# Patient Record
Sex: Male | Born: 1995 | Hispanic: Yes | Marital: Single
Health system: Southern US, Community
[De-identification: ages and names within clinical notes are randomized; demographics above are authoritative.]

---
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
## 2020-07-19 ENCOUNTER — Emergency Department (HOSPITAL_COMMUNITY)
Admission: EM | Admit: 2020-07-19 | Discharge: 2020-07-20 | Payer: BC Managed Care – PPO | Attending: Emergency Medicine | Admitting: Emergency Medicine

## 2020-07-19 ENCOUNTER — Emergency Department (HOSPITAL_COMMUNITY): Payer: BC Managed Care – PPO

## 2020-07-19 ENCOUNTER — Encounter (HOSPITAL_COMMUNITY): Payer: Self-pay | Admitting: Emergency Medicine

## 2020-07-19 DIAGNOSIS — F29 Unspecified psychosis not due to a substance or known physiological condition: Secondary | ICD-10-CM

## 2020-07-19 DIAGNOSIS — S60812A Abrasion of left wrist, initial encounter: Secondary | ICD-10-CM | POA: Diagnosis not present

## 2020-07-19 DIAGNOSIS — Y92009 Unspecified place in unspecified non-institutional (private) residence as the place of occurrence of the external cause: Secondary | ICD-10-CM | POA: Insufficient documentation

## 2020-07-19 DIAGNOSIS — Z20822 Contact with and (suspected) exposure to covid-19: Secondary | ICD-10-CM | POA: Diagnosis not present

## 2020-07-19 DIAGNOSIS — W098XXA Fall on or from other playground equipment, initial encounter: Secondary | ICD-10-CM | POA: Diagnosis not present

## 2020-07-19 DIAGNOSIS — S00531A Contusion of lip, initial encounter: Secondary | ICD-10-CM | POA: Insufficient documentation

## 2020-07-19 DIAGNOSIS — F1590 Other stimulant use, unspecified, uncomplicated: Secondary | ICD-10-CM | POA: Diagnosis not present

## 2020-07-19 DIAGNOSIS — F989 Unspecified behavioral and emotional disorders with onset usually occurring in childhood and adolescence: Secondary | ICD-10-CM | POA: Diagnosis not present

## 2020-07-19 DIAGNOSIS — S0081XA Abrasion of other part of head, initial encounter: Secondary | ICD-10-CM | POA: Diagnosis not present

## 2020-07-19 DIAGNOSIS — S60811A Abrasion of right wrist, initial encounter: Secondary | ICD-10-CM | POA: Insufficient documentation

## 2020-07-19 DIAGNOSIS — F1410 Cocaine abuse, uncomplicated: Secondary | ICD-10-CM

## 2020-07-19 DIAGNOSIS — S00501A Unspecified superficial injury of lip, initial encounter: Secondary | ICD-10-CM | POA: Diagnosis present

## 2020-07-19 LAB — COMPREHENSIVE METABOLIC PANEL
ALT: 40 U/L (ref 0–44)
AST: 55 U/L — ABNORMAL HIGH (ref 15–41)
Albumin: 4.3 g/dL (ref 3.5–5.0)
Alkaline Phosphatase: 52 U/L (ref 38–126)
Anion gap: 11 (ref 5–15)
BUN: 15 mg/dL (ref 6–20)
CO2: 23 mmol/L (ref 22–32)
Calcium: 9.2 mg/dL (ref 8.9–10.3)
Chloride: 104 mmol/L (ref 98–111)
Creatinine, Ser: 1.28 mg/dL — ABNORMAL HIGH (ref 0.61–1.24)
GFR, Estimated: >60 mL/min (ref >60)
Glucose, Bld: 125 mg/dL — ABNORMAL HIGH (ref 70–99)
Potassium: 4.1 mmol/L (ref 3.5–5.1)
Sodium: 138 mmol/L (ref 135–145)
Total Bilirubin: 1.2 mg/dL (ref 0.3–1.2)
Total Protein: 7.2 g/dL (ref 6.5–8.1)

## 2020-07-19 LAB — CBC WITH DIFFERENTIAL/PLATELET
Abs Immature Granulocytes: 0.11 10*3/uL — ABNORMAL HIGH (ref 0.00–0.07)
Basophils Absolute: 0 10*3/uL (ref 0.0–0.1)
Basophils Relative: 0 %
Eosinophils Absolute: 0 10*3/uL (ref 0.0–0.5)
Eosinophils Relative: 0 %
HCT: 43.1 % (ref 39.0–52.0)
Hemoglobin: 14.8 g/dL (ref 13.0–17.0)
Immature Granulocytes: 1 %
Lymphocytes Relative: 6 %
Lymphs Abs: 1.2 10*3/uL (ref 0.7–4.0)
MCH: 29.1 pg (ref 26.0–34.0)
MCHC: 34.3 g/dL (ref 30.0–36.0)
MCV: 84.7 fL (ref 80.0–100.0)
Monocytes Absolute: 1.7 10*3/uL — ABNORMAL HIGH (ref 0.1–1.0)
Monocytes Relative: 9 %
Neutro Abs: 16.3 10*3/uL — ABNORMAL HIGH (ref 1.7–7.7)
Neutrophils Relative %: 84 %
Platelets: 369 10*3/uL (ref 150–400)
RBC: 5.09 MIL/uL (ref 4.22–5.81)
RDW: 12.5 % (ref 11.5–15.5)
WBC: 19.4 10*3/uL — ABNORMAL HIGH (ref 4.0–10.5)
nRBC: 0 % (ref 0.0–0.2)

## 2020-07-19 LAB — RAPID URINE DRUG SCREEN, HOSP PERFORMED
Amphetamines: NOT DETECTED
Barbiturates: NOT DETECTED
Benzodiazepines: NOT DETECTED
Cocaine: POSITIVE — AB
Opiates: NOT DETECTED
Tetrahydrocannabinol: POSITIVE — AB

## 2020-07-19 LAB — SALICYLATE LEVEL: Salicylate Lvl: <7 mg/dL — ABNORMAL LOW (ref 7.0–30.0)

## 2020-07-19 LAB — RESPIRATORY PANEL BY RT PCR (FLU A&B, COVID)
Influenza A by PCR: NEGATIVE
Influenza B by PCR: NEGATIVE
SARS Coronavirus 2 by RT PCR: NEGATIVE

## 2020-07-19 LAB — ETHANOL: Alcohol, Ethyl (B): <10 mg/dL (ref <10)

## 2020-07-19 LAB — ACETAMINOPHEN LEVEL: Acetaminophen (Tylenol), Serum: <10 ug/mL — ABNORMAL LOW (ref 10–30)

## 2020-07-19 MED ORDER — ACETAMINOPHEN 325 MG PO TABS
650.0000 mg | ORAL_TABLET | Freq: Once | ORAL | Status: DC
  Filled 2020-07-19: qty 2

## 2020-07-19 MED ORDER — BACITRACIN ZINC 500 UNIT/GM EX OINT: TOPICAL_OINTMENT | Freq: Two times a day (BID) | CUTANEOUS | Status: DC
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
## 2020-07-19 NOTE — ED Provider Notes (Deleted)
Medical screening examination/treatment/procedure(s) were conducted as a shared visit with non-physician practitioner(s) and myself.  I personally evaluated the patient during the encounter.       Arby Barrette, MD 07/20/20 1446

## 2020-07-19 NOTE — Progress Notes (Signed)
Per Elta Guadeloupe, NP, pt meets inpatient criteria. There are no available beds at [facility] currently. Per Gray Bernhardt, Interim-President at [facility], Disposition will no longer fax patients out who are in [facility] EDs, unless the patient specifically asks to be placed somewhere other than [facility].   TTS continuing to assess.  Aldonia Keeven S. Alan Ripper, MSW, LCSW Clinical Social Worker 07/19/2020 4:05 PM

## 2020-07-19 NOTE — ED Notes (Signed)
[employer] at bedside 

## 2020-07-19 NOTE — BH Assessment (Signed)
Tele Assessment Note   Patient Name: Matthew Harrison MRN: [id number] Referring Physician: EDP Location of Patient: [facility] Location of Provider: Behavioral Health TTS Department  Matthew Harrison is a 24 y.o. male who presented to [facility] in police custody.  Pt lives alone in [place], and he is unemployed.  Pt stated that he has not been assessed by TTS before.  Per hospital report, [employer] went to Pt's home after neighbors reported suspicious noises and observable behavior.  Pt was observed standing naked on his roof and then jumping 8 feet to the gravel driveway below.  Per report, Pt told hospital staff, '''Everybody is acting strange to me... I jump off the roof all the time.''  Pt is now in police custody.  Pt has not been assessed by TTS before.  Pt was assessed.  He had to be asked to remove the sheet off his head.  He reported that he did not know why he was at the hospital.  ''The police just stormed me.''  Pt admitted that he climbed onto his roof and jumped off while naked, stating that he has done it before.  ''I was just minding my own business.''  Pt stated that he has jumped off his roof before.  ''I jump on and off a lot of things.''  Pt stated that he is trying to lose weight and he exercises.  Pt denied that his jump was a suicide attempt, and he denied past suicidal ideation or attempt, homicidal ideation, hallucination, and self-injurious behavior.  Pt endorsed use of cocaine, Delta 8, and marijuana.  Pt was vague about how often and frequency -- he stated that he ''may'' have used cocaine and Delta 8 recently.  He reported that he uses Delta 8 to help with sleep.  Pt denied any past or current psychiatric care.  UDS was not available at time of assessment.  Pt stated that he wanted to leave the hospital.  He reported that his home does not have power --''I can't see s** at night.''  During assessment, Pt presented as alert and oriented.  He had fair eye contact.  Demeanor was guarded.  Pt was  shirtless.  Pt's mood was ambiguous, and affect was ambivalent.  Speech was normal in rate, rhythm, and volume.  Thought processes were within normal range, and thought content was circumstantial.  Memory and concentration were intact.  Insight, judgment, and impulse control were poor.  Consulted with Rhona Raider, NP, who determined that Pt should remain at ED overnight, sober and stabilize, and evaluate again for suicidality. Diagnosis: Cocaine Use Disorder; bizarre behavior.   Past Medical History: History reviewed. No pertinent past medical history.  History reviewed. No pertinent surgical history.  Family History: No family history on file.  Social History:  reports current alcohol use. He reports current drug use. Drugs: Marijuana, Cocaine, and Other-see comments. No history on file for tobacco use.  Additional Social History:  Alcohol / Drug Use Pain Medications: See MAR Prescriptions: See MAR Over the Counter: See MAR History of alcohol / drug use?: Yes Substance #1 Name of Substance 1: Cocaine 1 - Amount (size/oz): Varied 1 - Frequency: Episodic 1 - Duration: Ongoing 1 - Last Use / Amount: Approx 07/17/2020 Substance #2 Name of Substance 2: Delta 8 2 - Last Use / Amount: Not sure Substance #3 Name of Substance 3: Marijuana  CIWA: CIWA-Ar BP: (!) 159/101 Pulse Rate: (!) 125 COWS:    Allergies: No Known Allergies  Home Medications: (Not in a  hospital admission)   OB/GYN Status:  No LMP for male patient.  General Assessment Data Location of Assessment: [facility] TTS Assessment: In system Is this a Tele or Face-to-Face Assessment?: Tele Assessment Is this an Initial Assessment or a Re-assessment for this encounter?: Initial Assessment Language Other than English: No Living Arrangements: Other (Comment) What gender do you identify as?: Male Date Telepsych consult ordered in [facility]: 07/19/20 Marital status: Single Pregnancy Status: No Living Arrangements: Alone Can pt  return to current living arrangement?: Yes Admission Status: Other (Comment) (in police custody) Is patient capable of signing voluntary admission?: Yes Referral Source: Other ([employer]) Insurance type:  Government social research officer)     Crisis Care Plan Living Arrangements: Alone Name of Psychiatrist:  (Pt denied) Name of Therapist:  (Pt denied)  Education Status Is patient currently in school?: No Is the patient employed, unemployed or receiving disability?: Unemployed  Risk to self with the past 6 months Suicidal Ideation: No Has patient been a risk to self within the past 6 months prior to admission? : No Suicidal Intent: No Has patient had any suicidal intent within the past 6 months prior to admission? : No Is patient at risk for suicide?: No Suicidal Plan?: No Has patient had any suicidal plan within the past 6 months prior to admission? : No Access to Means: No What has been your use of drugs/alcohol within the last 12 months?:  (Cocaine, Delta 8, Marijuana, Alcohol) Previous Attempts/Gestures: No Other Self Harm Risks:  (Substanced use) Intentional Self Injurious Behavior: Damaging Comment - Self Injurious Behavior:  (Pt has jumped off his roof several times) Family Suicide History: No Recent stressful life event(s): Other (Comment) (Substance use) Persecutory voices/beliefs?: No Depression: No Depression Symptoms: Insomnia Substance abuse history and/or treatment for substance abuse?: Yes Suicide prevention information given to non-admitted patients: Not applicable  Risk to Others within the past 6 months Homicidal Ideation: No Does patient have any lifetime risk of violence toward others beyond the six months prior to admission? : No Thoughts of Harm to Others: No Current Homicidal Intent: No Current Homicidal Plan: No Access to Homicidal Means: No History of harm to others?: No Does patient have access to weapons?: No Criminal Charges Pending?: No Does patient have a court date:  No Is patient on probation?: Unknown  Psychosis Hallucinations: None noted Delusions: None noted  Mental Status Report Appearance/Hygiene: Other (Comment) (shritless) Eye Contact: Fair Motor Activity: Freedom of movement, Unremarkable Speech: Other (Comment) (guarded) Level of Consciousness: Alert Mood: Ambivalent Affect: Other (Comment) (Guarded) Anxiety Level: None Thought Processes: Circumstantial Judgement: Impaired Orientation: Person, Place, Time, Situation Obsessive Compulsive Thoughts/Behaviors: None  Cognitive Functioning Concentration: Fair Memory: Recent Intact, Remote Intact Is patient IDD: No Insight: Poor Impulse Control: Poor Appetite: Good Have you had any weight changes? : No Change Sleep: Decreased Total Hours of Sleep:  (Mixed without use of Delta 8 to aid sleep) Vegetative Symptoms: None  ADLScreening [facility] Assessment Services) Patient's cognitive ability adequate to safely complete daily activities?: Yes Patient able to express need for assistance with ADLs?: Yes Independently performs ADLs?: Yes (appropriate for developmental age)  Prior Inpatient Therapy Prior Inpatient Therapy: No  Prior Outpatient Therapy Prior Outpatient Therapy: No Does patient have an ACCT team?: No Does patient have Intensive In-House Services?  : No Does patient have [facility] services? : No Does patient have P4CC services?: No  ADL Screening (condition at time of admission) Patient's cognitive ability adequate to safely complete daily activities?: Yes Is the patient deaf or  have difficulty hearing?: No Does the patient have difficulty seeing, even when wearing glasses/contacts?: No Patient able to express need for assistance with ADLs?: Yes Does the patient have difficulty dressing or bathing?: No Independently performs ADLs?: Yes (appropriate for developmental age) Does the patient have difficulty walking or climbing stairs?: No Weakness of Legs: None Weakness of  Arms/Hands: None  Home Assistive Devices/Equipment Home Assistive Devices/Equipment: None  Therapy Consults (therapy consults require a physician order) PT Evaluation Needed: No OT Evalulation Needed: No SLP Evaluation Needed: No Abuse/Neglect Assessment (Assessment to be complete while patient is alone) Abuse/Neglect Assessment Can Be Completed: Yes Physical Abuse: Denies Verbal Abuse: Denies Sexual Abuse: Denies Exploitation of patient/patient's resources: Denies Self-Neglect: Denies Values / Beliefs Cultural Requests During Hospitalization: None Spiritual Requests During Hospitalization: None Consults Spiritual Care Consult Needed: No Transition of Care Team Consult Needed: No Advance Directives (For Healthcare) Does Patient Have a Medical Advance Directive?: No Would patient like information on creating a medical advance directive?: No - Patient declined          Disposition:  Disposition Initial Assessment Completed for this Encounter: Yes  This service was provided via telemedicine using a 2-way, interactive audio and video technology.  Names of all persons participating in this telemedicine service and their role in this encounter. Name: Cletis Athens Role: Patient             Earline Mayotte 07/19/2020 2:28 PM

## 2020-07-19 NOTE — ED Notes (Signed)
IVC papers served.  Original Findings & Custody, Affidavit, and Exam and Recommendation documents placed in magistrate folder.  A copy sent to medical records, copy faxed to [facility] [phone],  and three sets on patient clipboard as per done by Wynelle Bourgeois, Diplomatic Services operational officer.

## 2020-07-19 NOTE — ED Notes (Signed)
Officer removed cuffs from wrists and pt has several lacerations to bilateral wrists, bleeding controlled at this time.

## 2020-07-20 NOTE — ED Notes (Signed)
Lunch Tray Ordered @ 1050. 

## 2020-07-20 NOTE — ED Provider Notes (Signed)
Emergency Medicine Observation Re-evaluation Note  Matthew Harrison is a 24 y.o. male, seen on rounds today.  Pt initially presented to the ED for complaints of Behavior Problem Currently, the patient is resting comfortably in no distress, denies SI.HI  Physical Exam  BP (!) 161/91   Pulse 99   Temp 98.8 F (37.1 C) (Oral)   Resp 18   SpO2 99%  Physical Exam General: no distress   Psych: calm and cooperative  ED Course / MDM  EKG:  Clinical Course as of Jul 20 1150  Sun Jul 19, 2020  1401 Leukocytosis present likely demarginalization secondary to being clubbed  CBC with Diff(!) [WF]  1401 He has no infectious symptoms.   [WF]  1401 CMP without any significant abnormality.  Mild AST elevation likely secondary to some alcohol use which he endorses.   [WF]  1401 Ethanol salicylate and Tylenol all undetectable.   [WF]  1401 Rapid urine drug screen and Covid PCR pending.   [WF]  1401 This has   [WF]  1402 TTS has called to evaluate patient.   [WF]  1445 Left ankle and tib-fib plain film without any fractures.  No dislocations.  Agree of radiology read   [WF]  1445 Covid test is negative.  Urine drug screen pending.   [WF]    Clinical Course User Index [WF] [facility]ail[facility], [place]   I have reviewed the labs performed to date as well as medications administered while in observation.  Recent changes in the last 24 hours include re-evaluated by Psych and cleared for discharge.  Plan  Current plan is for discharge into police custody.  Patient is under full IVC at this time which will be reversed.    Pollyann Savoy, MD 07/20/20 1153

## 2020-07-20 NOTE — ED Notes (Signed)
Ordered breakfast 

## 2020-07-20 NOTE — ED Notes (Signed)
Pt is awake,alert,calm,cooperative speaking with tts. GP remains with patient.

## 2020-07-20 NOTE — Progress Notes (Signed)
Patient ID: Matthew Harrison, male   DOB: 19-Nov-1995, 24 y.o.   MRN: [id number]   Psychiatric Reassessment   [name] Matthew Harrison is a 24 y.o. male who presented to [facility] in police custody.  Pt lives alone in [place], and he is unemployed.  Pt stated that he has not been assessed by TTS before.  Per hospital report, [employer] went to Pt's home after neighbors reported suspicious noises and observable behavior.  Pt was observed standing naked on his roof and then jumping 8 feet to the gravel driveway below.  Per report, Pt told hospital staff, '''Everybody is acting strange to me... I jump off the roof all the time.''  Pt is now in police custody.  Pt has not been assessed by TTS before.  Pt was assessed.  He had to be asked to remove the sheet off his head.  He reported that he did not know why he was at the hospital.  ''The police just stormed me.''  Pt admitted that he climbed onto his roof and jumped off while naked, stating that he has done it before.  ''I was just minding my own business.''  Pt stated that he has jumped off his roof before.  ''I jump on and off a lot of things.''  Pt stated that he is trying to lose weight and he exercises.  Pt denied that his jump was a suicide attempt, and he denied past suicidal ideation or attempt, homicidal ideation, hallucination, and self-injurious behavior.  Pt endorsed use of cocaine, Delta 8, and marijuana.  Pt was vague about how often and frequency -- he stated that he ''may'' have used cocaine and Delta 8 recently.  He reported that he uses Delta 8 to help with sleep.  Pt denied any past or current psychiatric care.  UDS was not available at time of assessment.  Pt stated that he wanted to leave the hospital.  He reported that his home does not have power --''I can't see s** at night.''  Psychiatric Evaluation:Matthew Harrison is a 24 y.o. male who presented to [facility] in police custody.for concerns as noted above. During this evaluation, he was alert and oriented x4,  guarded but cooperative. He has no prior psychiatric history other than substance abuse per self-report. When asked about his reason for being taken to the ED by police he replied," I don't know. All I know is that the police tased me.and brought me here."  He admitted to being found standing on his roof and then jumping 8 feet to the gravel driveway below. He denied that he was naked. Stated he was on the roof because," I saw an airplane and I wanted to see it closer." He admitted to using both cocaine, Delta 8, and marijuana but declined to disclose how frequent the use. His UDS was positive for THC and cocaine. He denied past suicidal ideation or attempt, homicidal ideation, hallucination, and self-injurious behavior.Denied access to firearm. Gave verbal consent to speak to his significant other, Abran Duke and roommate Alferd Apa  for collateral information. Stated he did not know room mates number and it was in his phone which he did not have.   I spoke to [employer], [phone] for collateral support. She stated that currently, she is not in town however, she did receive information that patient was  the hospital. She stated," I just heard that he had been having some crazy behaviors, but he has never done anything like that in the past. I am not sure if  he is using drugs but I saw were he sent a guy a message and some money who is known to use drugs so maybe he had something." She added that patient has never tried to harm himself in the past and that she did not think he would. Stated that she had spoke to patients room mate about the situation and provided patients room mate number. She denied that there were firearms int he home.   I spoke to patients roommate who stated," I have been knowing him for 7 years and we had been room mates for the last three. He is not a bad person but my neighbors told me about hs behaviors which was a surprise to me." He stated that he was unaware of any drug use but  did know that patient drinks alcohol often.  Reported that 4-5 years ago, patient had a similar incident like this where his behaviors were bizarre. Stated the neighbors said that they were concerned because he was on top of a roof. Stated," I feel at times he might have been depressed but he has never tried to harm himself or anyone." He denied that there were firearms in the home.   Disposition: Patient denies current IS, HI and psychosis. During this evaluation, there were no signs that he was responding to internal or external stimuli. I spoke to patients nurse who also stated that she has not observed signs of psychosis. She added that [employer] has remained at patients bedside because patient has current warrant for assault on an officer.   There is no evidence of imminent risk to self or others at present. There are no  current signs of psychosis. 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 ED nurse updated on disposition.

## 2020-07-22 ENCOUNTER — Encounter: Payer: Self-pay | Admitting: Emergency Medicine

## 2020-08-09 NOTE — Progress Notes (Deleted)
   Subjective:    Patient ID: Matthew Harrison, male    DOB: 07/18/1996, 24 y.o.   MRN: [id number]  24 y.o.M f/u hosp visit      Review of Systems     Objective:   Physical Exam        Assessment & Plan:

## 2020-08-10 ENCOUNTER — Inpatient Hospital Stay: Payer: Self-pay | Admitting: Critical Care Medicine

## 2022-01-17 IMAGING — DX DG ANKLE COMPLETE 3+V*L*
3 series · 3 of 3 positions shown · non-contrast
Comparison: None.

CLINICAL DATA: Lower leg injury

EXAM:
LEFT ANKLE COMPLETE - 3+ VIEW; LEFT TIBIA AND FIBULA - 2 VIEW

[ankle ap]
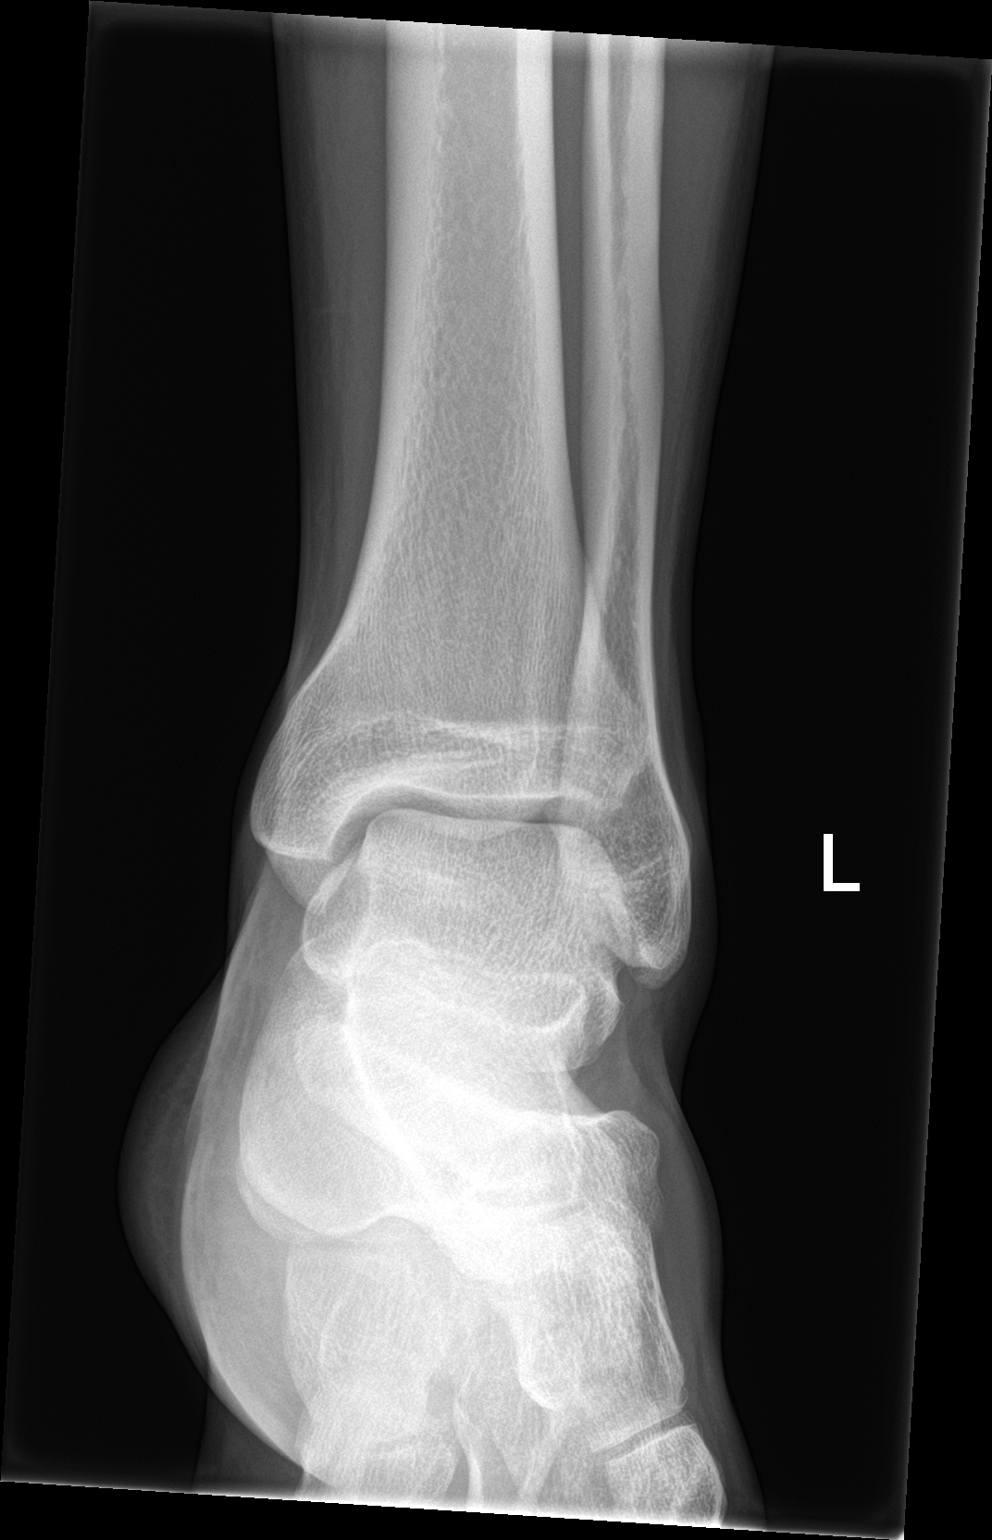

[ankle obl]
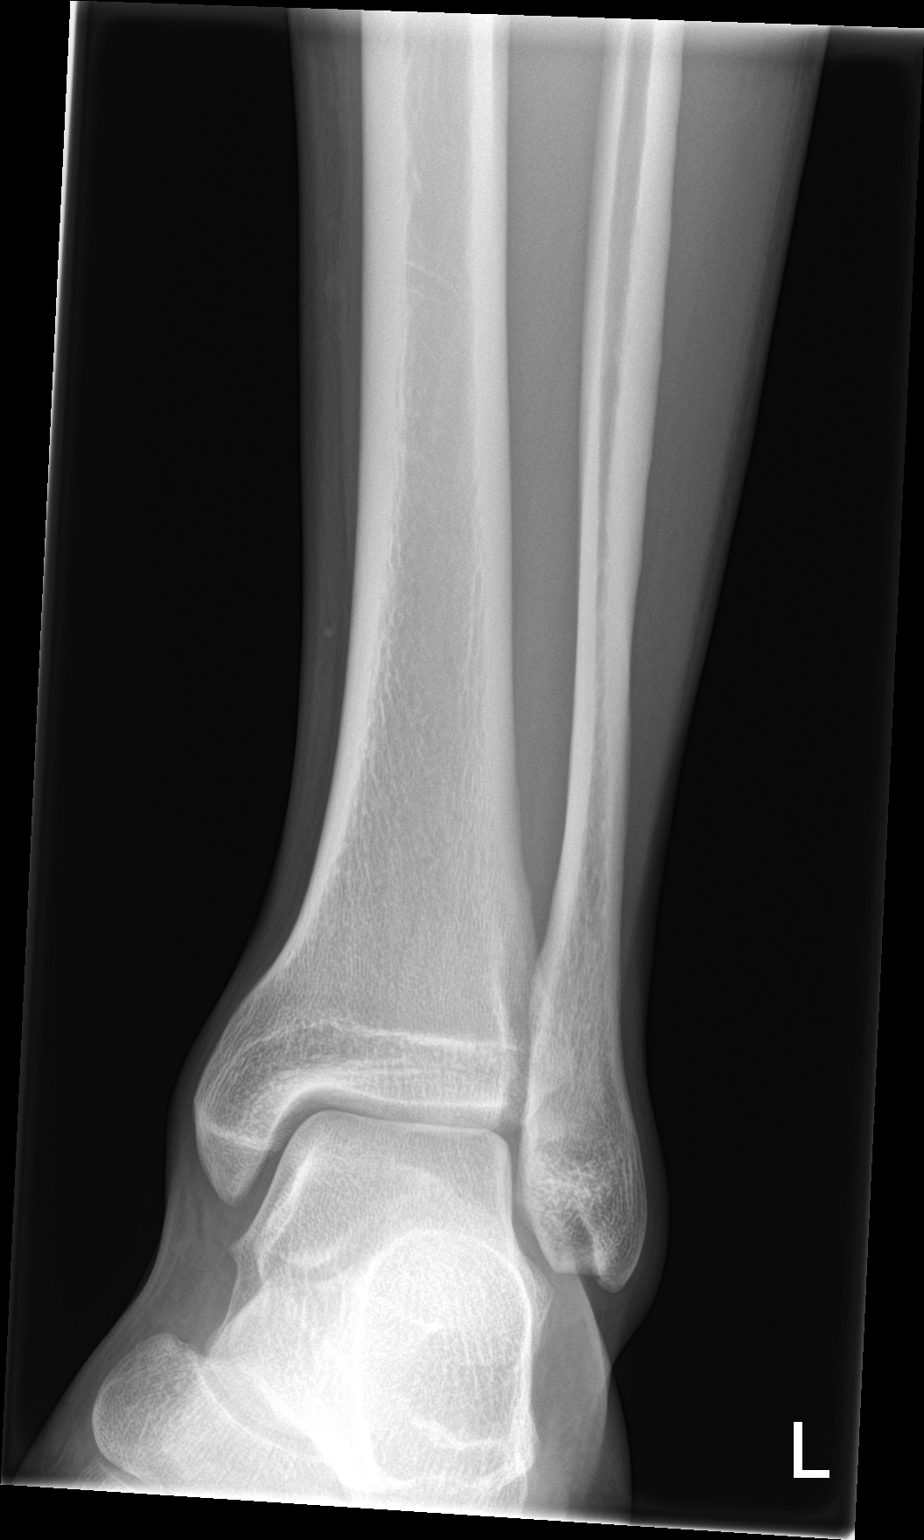

[ankle lat]
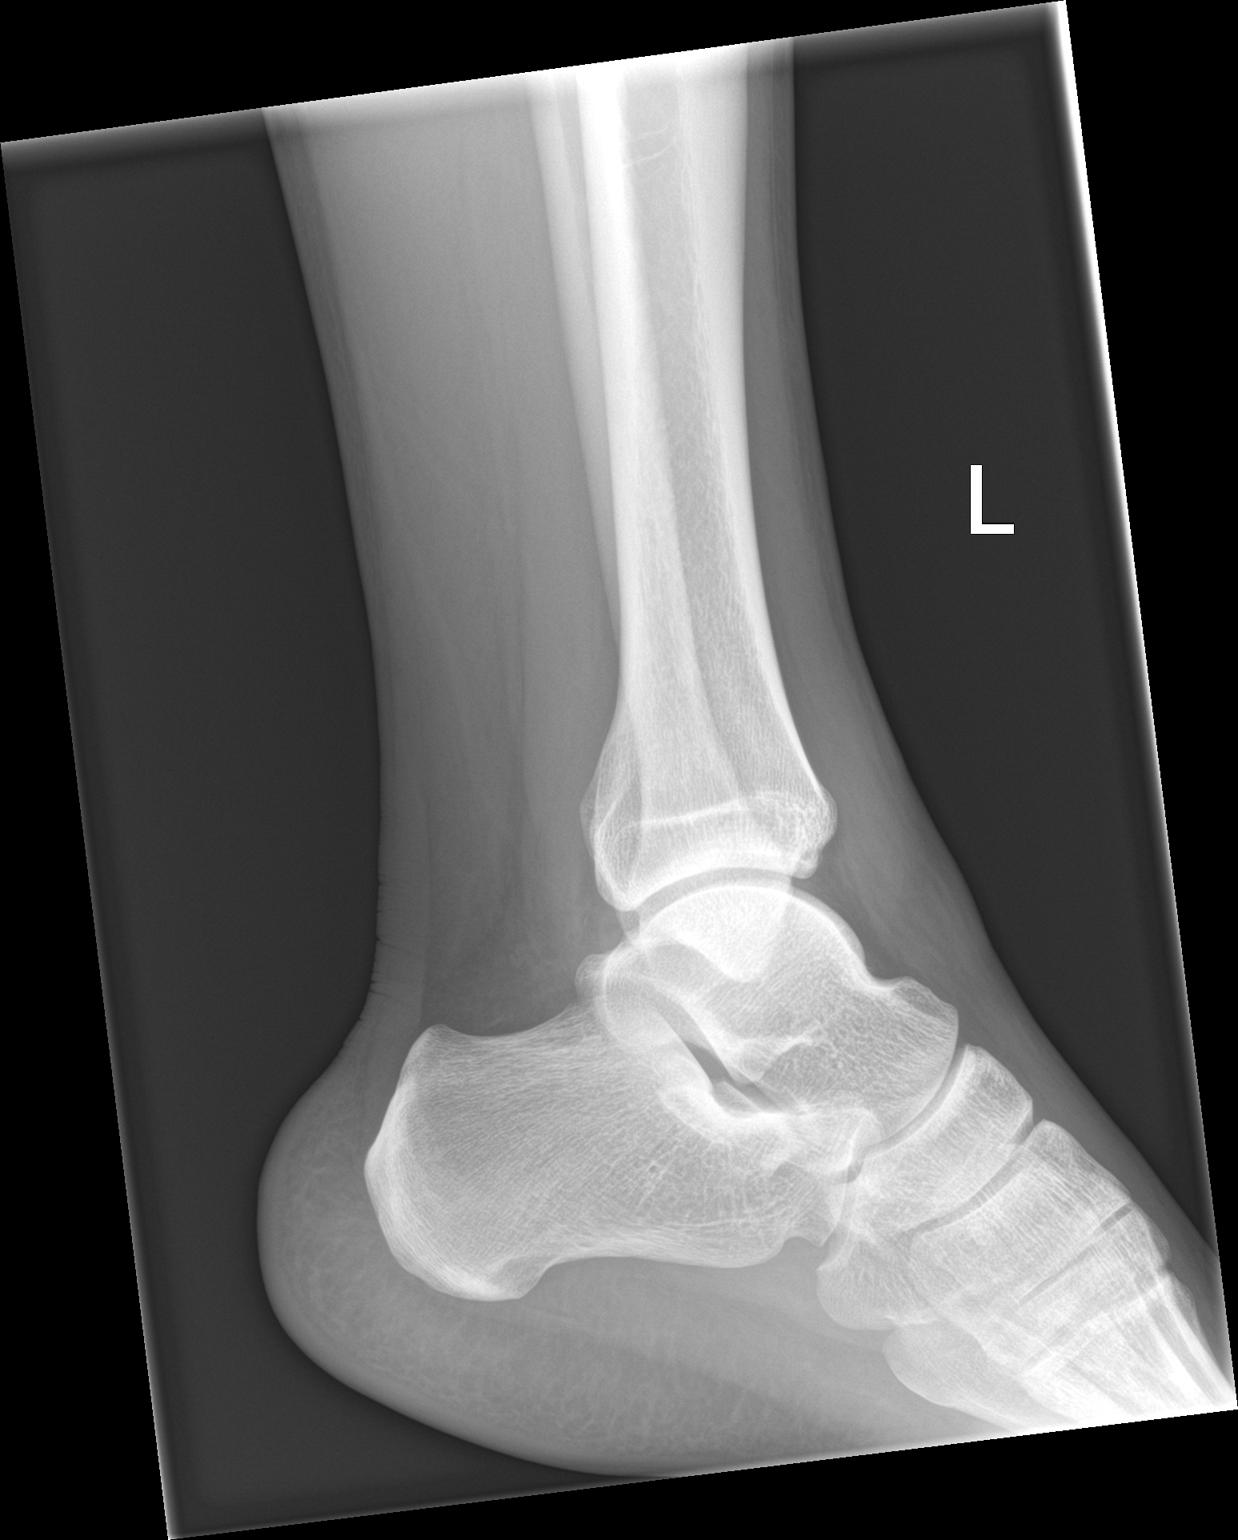

[3 of 3 positions shown; findings below may reference images not displayed]

FINDINGS: There is no evidence of fracture, dislocation, or joint effusion.
There is no evidence of arthropathy or other focal bone abnormality.
Soft tissues are unremarkable.
IMPRESSION: No fracture or dislocation of the left tibia or fibula or left
ankle.
